# Patient Record
Sex: Male | Born: 1990 | Race: White | Hispanic: No | Marital: Single | Smoking: Former smoker
Health system: Southern US, Community
[De-identification: ages and names within clinical notes are randomized; demographics above are authoritative.]

## PROBLEM LIST (undated history)

## (undated) DIAGNOSIS — M25539 Pain in unspecified wrist: Secondary | ICD-10-CM

## (undated) DIAGNOSIS — G8929 Other chronic pain: Secondary | ICD-10-CM

## (undated) DIAGNOSIS — M79643 Pain in unspecified hand: Secondary | ICD-10-CM

## (undated) HISTORY — PX: VASECTOMY: SHX75

---

## 2012-04-08 ENCOUNTER — Emergency Department: Payer: Self-pay | Admitting: Unknown Physician Specialty

## 2012-04-08 LAB — CBC WITH DIFFERENTIAL/PLATELET
Basophil #: 0 10*3/uL (ref 0.0–0.1)
Eosinophil #: 0.1 10*3/uL (ref 0.0–0.7)
Eosinophil %: 1.1 %
Lymphocyte #: 1.4 10*3/uL (ref 1.0–3.6)
Lymphocyte %: 31.7 %
MCHC: 34 g/dL (ref 32.0–36.0)
Monocyte %: 11.4 %
Platelet: 249 10*3/uL (ref 150–440)
RBC: 5.23 10*6/uL (ref 4.40–5.90)
RDW: 12.5 % (ref 11.5–14.5)
WBC: 4.4 10*3/uL (ref 3.8–10.6)

## 2012-04-08 LAB — URINALYSIS, COMPLETE
Bacteria: NONE SEEN
Bilirubin,UR: NEGATIVE
Glucose,UR: NEGATIVE mg/dL (ref 0–75)
Ketone: NEGATIVE
Leukocyte Esterase: NEGATIVE
Ph: 6 (ref 4.5–8.0)
Protein: NEGATIVE
RBC,UR: <1 /HPF (ref 0–5)
Squamous Epithelial: NONE SEEN

## 2012-04-08 LAB — COMPREHENSIVE METABOLIC PANEL
Albumin: 4.4 g/dL (ref 3.4–5.0)
Alkaline Phosphatase: 74 U/L (ref 50–136)
Anion Gap: 4 — ABNORMAL LOW (ref 7–16)
BUN: 12 mg/dL (ref 7–18)
Bilirubin,Total: 0.4 mg/dL (ref 0.2–1.0)
Calcium, Total: 9.1 mg/dL (ref 8.5–10.1)
Creatinine: 0.76 mg/dL (ref 0.60–1.30)
EGFR (African American): >60
EGFR (Non-African Amer.): >60
Glucose: 80 mg/dL (ref 65–99)
Potassium: 4.1 mmol/L (ref 3.5–5.1)
SGOT(AST): 25 U/L (ref 15–37)
SGPT (ALT): 25 U/L (ref 12–78)
Total Protein: 7.4 g/dL (ref 6.4–8.2)

## 2012-04-08 LAB — LIPASE, BLOOD: Lipase: 77 U/L (ref 73–393)

## 2012-09-04 ENCOUNTER — Ambulatory Visit: Payer: Self-pay

## 2013-08-18 ENCOUNTER — Ambulatory Visit: Payer: Self-pay | Admitting: Physician Assistant

## 2014-08-03 ENCOUNTER — Ambulatory Visit
Admit: 2014-08-03 | Disposition: A | Discharge status: Home / Self Care | Payer: Self-pay | Attending: Internal Medicine | Admitting: Internal Medicine

## 2014-08-03 LAB — URINALYSIS, COMPLETE
Bacteria: NEGATIVE
Bilirubin,UR: NEGATIVE
Blood: NEGATIVE
Glucose,UR: NEGATIVE
Ketone: NEGATIVE
Leukocyte Esterase: NEGATIVE
Nitrite: NEGATIVE
Ph: 7.5 (ref 5.0–8.0)
Protein: NEGATIVE
Specific Gravity: 1.015 (ref 1.000–1.030)
Squamous Epithelial: NONE SEEN
WBC UR: NONE SEEN /HPF (ref 0–5)

## 2015-10-10 ENCOUNTER — Ambulatory Visit (INDEPENDENT_AMBULATORY_CARE_PROVIDER_SITE_OTHER): Payer: Federal, State, Local not specified - PPO

## 2015-10-10 ENCOUNTER — Ambulatory Visit
Admission: EM | Admit: 2015-10-10 | Discharge: 2015-10-10 | Disposition: A | Discharge status: Home / Self Care | Payer: Federal, State, Local not specified - PPO | Attending: Family Medicine | Admitting: Family Medicine

## 2015-10-10 ENCOUNTER — Encounter: Payer: Self-pay | Admitting: *Deleted

## 2015-10-10 DIAGNOSIS — S7001XA Contusion of right hip, initial encounter: Secondary | ICD-10-CM

## 2015-10-10 MED ORDER — TRAMADOL HCL 50 MG PO TABS: 50.0000 mg | ORAL_TABLET | Freq: Three times a day (TID) | ORAL | Status: DC | PRN

## 2015-10-10 MED ORDER — MELOXICAM 15 MG PO TABS
15.0000 mg | ORAL_TABLET | Freq: Every day | ORAL | Status: DC | PRN
Start: 2015-10-10 — End: 2016-01-13
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
## 2015-10-10 NOTE — Discharge Instructions (Signed)
° °  Take medication as prescribed. Rest. Apply ice as needed.   Follow up with your primary care physician this week as needed. Return to Urgent care for new or worsening concerns.   Contusion A contusion is a deep bruise. Contusions are the result of a blunt injury to tissues and muscle fibers under the skin. The injury causes bleeding under the skin. The skin overlying the contusion may turn blue, purple, or yellow. Minor injuries will give you a painless contusion, but more severe contusions may stay painful and swollen for a few weeks.  CAUSES  This condition is usually caused by a blow, trauma, or direct force to an area of the body. SYMPTOMS  Symptoms of this condition include:  Swelling of the injured area.  Pain and tenderness in the injured area.  Discoloration. The area may have redness and then turn blue, purple, or yellow. DIAGNOSIS  This condition is diagnosed based on a physical exam and medical history. An X-ray, CT scan, or MRI may be needed to determine if there are any associated injuries, such as broken bones (fractures). TREATMENT  Specific treatment for this condition depends on what area of the body was injured. In general, the best treatment for a contusion is resting, icing, applying pressure to (compression), and elevating the injured area. This is often called the RICE strategy. Over-the-counter anti-inflammatory medicines may also be recommended for pain control.  HOME CARE INSTRUCTIONS   Rest the injured area.  If directed, apply ice to the injured area:  Put ice in a plastic bag.  Place a towel between your skin and the bag.  Leave the ice on for 20 minutes, 2-3 times per day.  If directed, apply light compression to the injured area using an elastic bandage. Make sure the bandage is not wrapped too tightly. Remove and reapply the bandage as directed by your health care provider.  If possible, raise (elevate) the injured area above the level of your  heart while you are sitting or lying down.  Take over-the-counter and prescription medicines only as told by your health care provider. SEEK MEDICAL CARE IF:  Your symptoms do not improve after several days of treatment.  Your symptoms get worse.  You have difficulty moving the injured area. SEEK IMMEDIATE MEDICAL CARE IF:   You have severe pain.  You have numbness in a hand or foot.  Your hand or foot turns pale or cold.   This information is not intended to replace advice given to you by your health care provider. Make sure you discuss any questions you have with your health care provider.   Document Released: 01/27/2005 Document Revised: 01/08/2015 Document Reviewed: 09/04/2014 Elsevier Interactive Patient Education [employer]2016 Elsevier Inc.

## 2016-01-13 ENCOUNTER — Encounter: Payer: Self-pay | Admitting: *Deleted

## 2016-01-13 ENCOUNTER — Ambulatory Visit
Admission: EM | Admit: 2016-01-13 | Discharge: 2016-01-13 | Disposition: A | Discharge status: Home / Self Care | Payer: Federal, State, Local not specified - PPO | Attending: Family Medicine | Admitting: Family Medicine

## 2016-01-13 DIAGNOSIS — G5603 Carpal tunnel syndrome, bilateral upper limbs: Secondary | ICD-10-CM

## 2016-01-13 HISTORY — DX: Pain in unspecified wrist: M25.539

## 2016-01-13 HISTORY — DX: Other chronic pain: G89.29

## 2016-01-13 HISTORY — DX: Pain in unspecified hand: M79.643

## 2016-01-13 MED ORDER — KETOROLAC TROMETHAMINE 60 MG/2ML IM SOLN
60.0000 mg | Freq: Once | INTRAMUSCULAR | Status: AC
  Administered 2016-01-13: 60 mg via INTRAMUSCULAR

## 2016-01-13 MED ORDER — NAPROXEN 500 MG PO TABS: 500.0000 mg | ORAL_TABLET | Freq: Two times a day (BID) | ORAL | 0 refills | Status: DC

## 2016-01-13 NOTE — ED Triage Notes (Signed)
Patient has a history of bilateral wrist pain that he receives treatment for thru [facility]. His hand and wrist pain have returned and has been severe for the last 3 days.

## 2016-01-13 NOTE — Discharge Instructions (Signed)
Patient should wear his splints continuously for relief of the pain. Take Naprosyn twice a day with food for pain control. Return to surgeon for further evaluation and care.

## 2016-01-13 NOTE — ED Provider Notes (Signed)
CSN: [id number]     Arrival date & time 01/13/16  1044 History   First MD Initiated Contact with Patient 01/13/16 1129     Chief Complaint  Patient presents with  . Wrist Pain  . Hand Pain   (Consider location/radiation/quality/duration/timing/severity/associated sxs/prior Treatment) HPI  This a 25 year old male who presents with bilateral wrist and hand pain that has been under the care and receiving cortisone injections and orthopedic surgeon for known carpal tunnel syndrome. The patient states that he's been having increasing pain and unable to work today and was not able to get into see his primary care physician or the orthopedic surgeon. The pain that he describes contains elements of both the median and ulnar nerves bilaterally. This apparently is not a pure carpal tunnel syndrome according to his symptoms. He states he's had an EMG in the past which after being performed the surgeon decided not to do carpal tunnel releases. 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  Past Medical History:  Diagnosis Date  . Chronic hand pain   . Wrist pain, chronic    History reviewed. No pertinent surgical history. History reviewed. No pertinent family history. Social History  Substance Use Topics  . Smoking status: Former Games developer  . Smokeless tobacco: Current User  . Alcohol use Yes    Review of Systems  Constitutional: Positive for activity change. Negative for chills, fatigue and fever.  Musculoskeletal: Positive for myalgias.  All other systems reviewed and are negative.   Allergies  Review of patient's allergies indicates no known allergies.  Home Medications   Prior to Admission medications   Medication Sig Start Date End Date Taking? Authorizing Provider  naproxen (NAPROSYN) 500 MG tablet Take 1 tablet (500 mg total) by mouth 2 (two) times daily with a meal. 01/13/16   Lutricia Feil, PA-C   Meds Ordered and Administered this  Visit   Medications  ketorolac (TORADOL) injection 60 mg (60 mg Intramuscular Given 01/13/16 1157)    BP 129/81 (BP Location: Left Arm)   Pulse 93   Temp 98 F (36.7 C) (Oral)   Resp 16   Ht 5\' 10"  (1.778 m)   Wt 135 lb (61.2 kg)   SpO2 99%   BMI 19.37 kg/m  No data found.   Physical Exam  Constitutional: He is oriented to person, place, and time. He appears well-developed and well-nourished. No distress.  HENT:  Head: Normocephalic and atraumatic.  Eyes: EOM are normal. Pupils are equal, round, and reactive to light. Right eye exhibits no discharge. Left eye exhibits no discharge.  Neck: Normal range of motion. Neck supple.  Musculoskeletal:  Examination of both hands shows no thenar wasting. He has had negative Tinel's at the wrist. Does have pain with attempted Phalen's complains immediately upon flexion. First dorsal interosseous appears strong. Is a normal sweating pattern.  Neurological: He is alert and oriented to person, place, and time.  Skin: Skin is warm and dry. He is not diaphoretic.  Psychiatric: He has a normal mood and affect. His behavior is normal. Judgment and thought content normal.  Nursing note and vitals reviewed.   Urgent Care Course   Clinical Course    Procedures (including critical care time)  Labs Review Labs Reviewed - No data to display  Imaging Review No results found.   Visual Acuity Review  Right Eye Distance:   Left Eye Distance:   Bilateral Distance:  Right Eye Near:   Left Eye Near:    Bilateral Near:     Medications  ketorolac (TORADOL) injection 60 mg (60 mg Intramuscular Given 01/13/16 1157)  Patient was given bilateral wrist splints  MDM   1. Bilateral carpal tunnel syndrome   The patient does not exhibit pure bilateral carpal tunnel syndrome characteristics but is having significant hand pain which hopefully will respond to the use of splints. I told him that he must use the splint  continuously removing them  only for personal care or showering. He should sleep with them as well. Given him an injection of Toradol to initiate anti-inflammatory effect Provide him with Naprosyn for ongoing anti-inflammatory coverage. I recommended that he return to his orthopedic surgeon for additional care and evaluation as necessary. Note for work today but want him to return to work tomorrow. He should also wear his wrist splints at work. Discharge Medication List as of 01/13/2016 12:08 PM    START taking these medications   Details  naproxen (NAPROSYN) 500 MG tablet Take 1 tablet (500 mg total) by mouth 2 (two) times daily with a meal., Starting Tue 01/13/2016, Normal           Lutricia FeilWilliam P Sujay Grundman, PA-C 01/13/16 1231

## 2017-09-24 ENCOUNTER — Ambulatory Visit
Admission: EM | Admit: 2017-09-24 | Discharge: 2017-09-24 | Disposition: A | Discharge status: Home / Self Care | Payer: Worker's Compensation | Attending: Family Medicine | Admitting: Family Medicine

## 2017-09-24 ENCOUNTER — Encounter: Payer: Self-pay | Admitting: Gynecology

## 2017-09-24 ENCOUNTER — Other Ambulatory Visit: Payer: Self-pay

## 2017-09-24 DIAGNOSIS — S0501XA Injury of conjunctiva and corneal abrasion without foreign body, right eye, initial encounter: Secondary | ICD-10-CM

## 2017-09-24 MED ORDER — MOXIFLOXACIN HCL 0.5 % OP SOLN: 1.0000 [drp] | Freq: Three times a day (TID) | OPHTHALMIC | 0 refills | Status: DC

## 2017-09-24 MED ORDER — HYDROCODONE-ACETAMINOPHEN 5-325 MG PO TABS: ORAL_TABLET | ORAL | 0 refills | Status: DC

## 2017-09-24 NOTE — ED Triage Notes (Signed)
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

## 2017-09-24 NOTE — ED Provider Notes (Signed)
[facility]    CSN: [id number] Arrival date & time: 09/24/17  0801     History   Chief Complaint Chief Complaint  Patient presents with  . Eye Injury  . Work Related Injury    HPI Derrick Porter is a 27 y.o. male.   27 yo male with a c/o right eye discomfort and foreign body sensation since last night after injuring it at work. States he was cutting and grinding some metal at work and wearing eye safety glasses, however still got some metal shavings and dust that was blowing around into his right eye. States he flushed it and at home was able to get some tiny pieces of metal out of his eye with a magnet.   The history is provided by the patient.    Past Medical History:  Diagnosis Date  . Chronic hand pain   . Wrist pain, chronic     There are no active problems to display for this patient.   History reviewed. No pertinent surgical history.     Home Medications    Prior to Admission medications   Medication Sig Start Date End Date Taking? Authorizing Provider  HYDROcodone-acetaminophen (NORCO/VICODIN) 5-325 MG tablet 1-2 tabs po bid prn 09/24/17   Payton Mccallum, MD  moxifloxacin (VIGAMOX) 0.5 % ophthalmic solution Place 1 drop into the right eye 3 (three) times daily. 09/24/17   Payton Mccallum, MD  naproxen (NAPROSYN) 500 MG tablet Take 1 tablet (500 mg total) by mouth 2 (two) times daily with a meal. 01/13/16   Lutricia Feil, PA-C    Family History History reviewed. No pertinent family history.  Social History Social History   Tobacco Use  . Smoking status: Former Games developer  . Smokeless tobacco: Current User  Substance Use Topics  . Alcohol use: Yes  . Drug use: No     Allergies   Patient has no known allergies.   Review of Systems Review of Systems   Physical Exam Triage Vital Signs ED Triage Vitals  Enc Vitals Group     BP 09/24/17 0829 115/77     Pulse Rate 09/24/17 0829 67     Resp 09/24/17 0829 16     Temp 09/24/17 0829  98.2 F (36.8 C)     Temp Source 09/24/17 0829 Oral     SpO2 09/24/17 0829 100 %     Weight 09/24/17 0828 145 lb (65.8 kg)     Height 09/24/17 0828  (1.778 m)     Head Circumference --      Peak Flow --      Pain Score 09/24/17 0828 7     Pain Loc --      Pain Edu? --      Excl. in GC? --    No data found.  Updated Vital Signs BP 115/77 (BP Location: Left Arm)   Pulse 67   Temp 98.2 F (36.8 C) (Oral)   Resp 16   Ht  (1.778 m)   Wt 145 lb (65.8 kg)   SpO2 100%   BMI 20.81 kg/m   Visual Acuity Right Eye Distance: 20/70 Left Eye Distance: 20/20 Bilateral Distance: 20/40(without corrective lens)  Right Eye Near:   Left Eye Near:    Bilateral Near:     Physical Exam  Constitutional: He appears well-developed and well-nourished. No distress.  Eyes: Pupils are equal, round, and reactive to light. EOM and lids are normal. Lids are everted and swept, no  foreign bodies found. Right eye exhibits no exudate. No foreign body present in the right eye. Right conjunctiva is injected. Right conjunctiva has no hemorrhage. Left conjunctiva is not injected.  Slit lamp exam:      The right eye shows corneal abrasion and fluorescein uptake.    Skin: He is not diaphoretic.  Nursing note and vitals reviewed.    UC Treatments / Results  Labs (all labs ordered are listed, but only abnormal results are displayed) Labs Reviewed - No data to display  EKG None  Radiology No results found.  Procedures Procedures (including critical care time)  Medications Ordered in UC Medications - No data to display  Initial Impression / Assessment and Plan / UC Course  I have reviewed the triage vital signs and the nursing notes.  Pertinent labs & imaging results that were available during my care of the patient were reviewed by me and considered in my medical decision making (see chart for details).     Final Clinical Impressions(s) / UC Diagnoses   Final diagnoses:    Abrasion of right cornea, initial encounter     Discharge Instructions     Tylenol and/or advil Follow up next week (Tuesday or Wednesday) with ophthalmologist (or go to Emergency Department over the weekend if symptoms worsen)    ED Prescriptions    Medication Sig Dispense Auth. Provider   moxifloxacin (VIGAMOX) 0.5 % ophthalmic solution Place 1 drop into the right eye 3 (three) times daily. 3 mL Payton Mccallum, MD   HYDROcodone-acetaminophen (NORCO/VICODIN) 5-325 MG tablet 1-2 tabs po bid prn 5 tablet Payton Mccallum, MD     1.diagnosis reviewed with patient 2. rx as per orders above; reviewed possible side effects, interactions, risks and benefits  3. Recommend supportive treatment with otc analgesics prn 4. Recommend follow up with ophthalmologist after the weekend (early next week); if symptoms worsen over the weekend then recommend going to Emergency Department 5. May not return to usual job until cleared by ophthalmologist to return    Controlled Substance Prescriptions [place] Controlled Substance Registry consulted? Not Applicable   Payton Mccallum, MD 09/24/17 1316

## 2017-09-24 NOTE — Discharge Instructions (Signed)
Tylenol and/or advil Follow up next week (Tuesday or Wednesday) with ophthalmologist (or go to Emergency Department over the weekend if symptoms worsen)

## 2019-02-07 ENCOUNTER — Other Ambulatory Visit: Payer: Self-pay | Admitting: *Deleted

## 2019-02-07 DIAGNOSIS — Z20822 Contact with and (suspected) exposure to covid-19: Secondary | ICD-10-CM

## 2019-02-08 LAB — NOVEL CORONAVIRUS, NAA: SARS-CoV-2, NAA: NOT DETECTED

## 2019-07-22 ENCOUNTER — Encounter: Payer: Self-pay | Admitting: Emergency Medicine

## 2019-07-22 ENCOUNTER — Ambulatory Visit
Admission: EM | Admit: 2019-07-22 | Discharge: 2019-07-22 | Disposition: A | Discharge status: Home / Self Care | Payer: PRIVATE HEALTH INSURANCE | Attending: Emergency Medicine | Admitting: Emergency Medicine

## 2019-07-22 ENCOUNTER — Other Ambulatory Visit: Payer: Self-pay

## 2019-07-22 DIAGNOSIS — Z20822 Contact with and (suspected) exposure to covid-19: Secondary | ICD-10-CM | POA: Insufficient documentation

## 2019-07-22 DIAGNOSIS — J020 Streptococcal pharyngitis: Secondary | ICD-10-CM | POA: Diagnosis not present

## 2019-07-22 DIAGNOSIS — Z87891 Personal history of nicotine dependence: Secondary | ICD-10-CM | POA: Diagnosis not present

## 2019-07-22 DIAGNOSIS — R509 Fever, unspecified: Secondary | ICD-10-CM | POA: Insufficient documentation

## 2019-07-22 DIAGNOSIS — J029 Acute pharyngitis, unspecified: Secondary | ICD-10-CM | POA: Insufficient documentation

## 2019-07-22 LAB — GROUP A STREP BY PCR: Group A Strep by PCR: DETECTED — AB

## 2019-07-22 MED ORDER — LIDOCAINE VISCOUS HCL 2 % MT SOLN: 10.0000 mL | OROMUCOSAL | 0 refills | Status: DC | PRN

## 2019-07-22 MED ORDER — PENICILLIN V POTASSIUM 500 MG PO TABS: 500.0000 mg | ORAL_TABLET | Freq: Two times a day (BID) | ORAL | 0 refills | Status: AC

## 2019-07-22 NOTE — ED Provider Notes (Signed)
[facility]    CSN: [id number] Arrival date & time: 07/22/19  1039      History   Chief Complaint Chief Complaint  Patient presents with  . Sore Throat  . Generalized Body Aches  . Fever    HPI Derrick Porter is a 29 y.o. male.   29 year old male presents with sore throat, body aches, chills and fever that started yesterday. Took temperature yesterday and had a fever of 102. Also having slight nasal congestion, ear pressure and nausea but denies any cough, vomiting or diarrhea. Has taken Tylenol and Nyquil with minimal relief. Wife was seen here at Urgent Care about 2 weeks ago and was positive for Strep- He indicated that she finished Zithromax (she is allergic to Penicillin) and symptoms had resolved but now having a sore throat again but has not returned to be re-evaluated. Otherwise no other chronic health issues for the patient and takes no daily medication.   The history is provided by the patient.    Past Medical History:  Diagnosis Date  . Chronic hand pain   . Wrist pain, chronic     There are no problems to display for this patient.   History reviewed. No pertinent surgical history.     Home Medications    Prior to Admission medications   Medication Sig Start Date End Date Taking? Authorizing Provider  lidocaine (XYLOCAINE) 2 % solution Use as directed 10 mLs in the mouth or throat every 3 (three) hours as needed (for throat pain- spit out, do not swallow). 07/22/19   Starlee Corralejo, Ali Lowe, NP  penicillin v potassium (VEETID) 500 MG tablet Take 1 tablet (500 mg total) by mouth 2 (two) times daily for 10 days. 07/22/19 08/01/19  Sudie Grumbling, NP    Family History Family History  Problem Relation Age of Onset  . Healthy Mother   . Hypertension Father     Social History Social History   Tobacco Use  . Smoking status: Former Games developer  . Smokeless tobacco: Current User  Substance Use Topics  . Alcohol use: Yes  . Drug use: No      Allergies   Patient has no known allergies.   Review of Systems Review of Systems  Constitutional: Positive for activity change, appetite change, chills, fatigue and fever. Negative for diaphoresis.  HENT: Positive for congestion, ear pain (pressure), rhinorrhea, sore throat and trouble swallowing. Negative for ear discharge, facial swelling, mouth sores, nosebleeds, postnasal drip, sinus pressure, sinus pain, sneezing and tinnitus.   Eyes: Negative for pain, discharge, redness and itching.  Respiratory: Negative for cough, chest tightness, shortness of breath and wheezing.   Gastrointestinal: Positive for nausea. Negative for abdominal pain, diarrhea and vomiting.  Musculoskeletal: Positive for arthralgias and myalgias. Negative for neck pain and neck stiffness.  Skin: Negative for color change, rash and wound.  Allergic/Immunologic: Negative for environmental allergies, food allergies and immunocompromised state.  Neurological: Positive for headaches. Negative for dizziness, seizures, syncope, weakness, light-headedness and numbness.  Hematological: Positive for adenopathy. Does not bruise/bleed easily.     Physical Exam Triage Vital Signs ED Triage Vitals  Enc Vitals Group     BP 07/22/19 1056 140/75     Pulse Rate 07/22/19 1056 79     Resp 07/22/19 1056 16     Temp 07/22/19 1056 100.1 F (37.8 C)     Temp Source 07/22/19 1056 Oral     SpO2 07/22/19 1056 99 %  Weight 07/22/19 1052 165 lb (74.8 kg)     Height 07/22/19 1052 5\' 10"  (1.778 m)     Head Circumference --      Peak Flow --      Pain Score 07/22/19 1052 9     Pain Loc --      Pain Edu? --      Excl. in [place]? --    No data found.  Updated Vital Signs BP 140/75 (BP Location: Left Arm)   Pulse 79   Temp 100.1 F (37.8 C) (Oral)   Resp 16   Ht 5\' 10"  (1.778 m)   Wt 165 lb (74.8 kg)   SpO2 99%   BMI 23.68 kg/m   Visual Acuity Right Eye Distance:   Left Eye Distance:   Bilateral Distance:     Right Eye Near:   Left Eye Near:    Bilateral Near:     Physical Exam Vitals and nursing note reviewed.  Constitutional:      General: He is awake. He is not in acute distress.    Appearance: He is well-developed and well-groomed. He is ill-appearing.     Comments: Patient is lying down on exam table in no acute distress but appears ill and tired.   HENT:     Head: Normocephalic and atraumatic.     Right Ear: Hearing, tympanic membrane, ear canal and external ear normal.     Left Ear: Hearing, tympanic membrane, ear canal and external ear normal.     Nose: Rhinorrhea present. Rhinorrhea is clear.     Right Sinus: No maxillary sinus tenderness or frontal sinus tenderness.     Left Sinus: No maxillary sinus tenderness or frontal sinus tenderness.     Mouth/Throat:     Lips: Pink.     Mouth: Mucous membranes are moist.     Pharynx: Uvula midline. Pharyngeal swelling and posterior oropharyngeal erythema present. No oropharyngeal exudate or uvula swelling.     Tonsils: No tonsillar exudate.  Eyes:     Extraocular Movements: Extraocular movements intact.     Conjunctiva/sclera: Conjunctivae normal.  Cardiovascular:     Rate and Rhythm: Normal rate and regular rhythm.     Heart sounds: Normal heart sounds. No murmur.  Pulmonary:     Effort: Pulmonary effort is normal. No respiratory distress.     Breath sounds: Normal breath sounds and air entry. No decreased air movement. No decreased breath sounds, wheezing, rhonchi or rales.  Musculoskeletal:     Cervical back: Normal range of motion and neck supple. No rigidity. Normal range of motion.  Lymphadenopathy:     Cervical: Cervical adenopathy present.     Right cervical: Superficial cervical adenopathy and deep cervical adenopathy present.     Left cervical: Superficial cervical adenopathy and deep cervical adenopathy present.  Skin:    General: Skin is warm and dry.     Capillary Refill: Capillary refill takes less than 2 seconds.   Neurological:     General: No focal deficit present.     Mental Status: He is alert and oriented to person, place, and time.  Psychiatric:        Mood and Affect: Mood normal.        Behavior: Behavior normal. Behavior is cooperative.        Thought Content: Thought content normal.        Judgment: Judgment normal.      UC Treatments / Results  Labs (all labs ordered are listed,  but only abnormal results are displayed) Labs Reviewed  GROUP A STREP BY PCR - Abnormal; Notable for the following components:      Result Value   Group A Strep by PCR DETECTED (*)    All other components within normal limits  SARS CORONAVIRUS 2 (TAT 6-24 HRS)    EKG   Radiology No results found.  Procedures Procedures (including critical care time)  Medications Ordered in UC Medications - No data to display  Initial Impression / Assessment and Plan / UC Course  I have reviewed the triage vital signs and the nursing notes.  Pertinent labs & imaging results that were available during my care of the patient were reviewed by me and considered in my medical decision making (see chart for details).    Reviewed positive strep test with patient. Recommend start Penicillin 500mg  twice a day as directed- finish all antibiotic even if feeling better. May use Viscous lidocaine- swish and spit out every 3 hours as needed for throat pain. Take Ibuprofen 800mg  as directed as needed for throat pain and fever. Rest. Stay at home. Push fluids to keep well hydrated. Follow-up pending COVID 19 test results and in 2 to 3 days if not improving.   Final Clinical Impressions(s) / UC Diagnoses   Final diagnoses:  Strep pharyngitis  Sore throat  Fever, unspecified     Discharge Instructions     Recommend start Penicillin 500mg  twice a day for 10 days. Use lidocaine mouth wash- swish and spit out every 3 hours as needed for throat pain. May also take Ibuprofen 800mg  every 8 hours as needed for pain. Rest. Stay at  home. Follow-up pending COVID 19 test results and in 2 to 3 days if not improving.     ED Prescriptions    Medication Sig Dispense Auth. Provider   penicillin v potassium (VEETID) 500 MG tablet Take 1 tablet (500 mg total) by mouth 2 (two) times daily for 10 days. 20 tablet , NP   lidocaine (XYLOCAINE) 2 % solution Use as directed 10 mLs in the mouth or throat every 3 (three) hours as needed (for throat pain- spit out, do not swallow). 100 mL , NP     PDMP not reviewed this encounter.   , NP 07/22/19 2121

## 2019-07-22 NOTE — ED Triage Notes (Signed)
Patient c/o sore throat, bodyaches, and fever that started yesterday.  Patient states that his wife was treated for strep throat.

## 2019-07-22 NOTE — Discharge Instructions (Addendum)
Recommend start Penicillin 500mg  twice a day for 10 days. Use lidocaine mouth wash- swish and spit out every 3 hours as needed for throat pain. May also take Ibuprofen 800mg  every 8 hours as needed for pain. Rest. Stay at home. Follow-up pending COVID 19 test results and in 2 to 3 days if not improving.

## 2019-07-23 LAB — SARS CORONAVIRUS 2 (TAT 6-24 HRS): SARS Coronavirus 2: NEGATIVE

## 2019-09-09 ENCOUNTER — Ambulatory Visit
Admission: EM | Admit: 2019-09-09 | Discharge: 2019-09-09 | Disposition: A | Discharge status: Home / Self Care | Payer: PRIVATE HEALTH INSURANCE | Attending: Family Medicine | Admitting: Family Medicine

## 2019-09-09 ENCOUNTER — Other Ambulatory Visit: Payer: Self-pay

## 2019-09-09 DIAGNOSIS — R05 Cough: Secondary | ICD-10-CM

## 2019-09-09 DIAGNOSIS — Z20822 Contact with and (suspected) exposure to covid-19: Secondary | ICD-10-CM | POA: Insufficient documentation

## 2019-09-09 DIAGNOSIS — R059 Cough, unspecified: Secondary | ICD-10-CM

## 2019-09-09 DIAGNOSIS — Z03818 Encounter for observation for suspected exposure to other biological agents ruled out: Secondary | ICD-10-CM

## 2019-09-09 NOTE — ED Provider Notes (Signed)
[place],    Name: Derrick Porter DOB: [birth year] MRN: [id number] CSN: [id number] PCP: Patient, No Pcp Per  Arrival date and time:  09/09/19 0856  Chief Complaint:  Cough  NOTE: Prior to seeing the patient today, I have reviewed the triage nursing documentation and vital signs. Clinical staff has updated patient's PMH/PSHx, current medication list, and drug allergies/intolerances to ensure comprehensive history available to assist in medical decision making.   History:   HPI: Derrick Porter is a 29 y.o. male who presents today with complaints of cough that started approximately 3 days ago. Symptoms started on Thursday with a generalized headache and paranasal sinus pressure. He notes that his boss developed similar symptoms. Over the last couple of days, sinus symptoms have resolved and everything has "moved into his chest". He has not experienced any fevers, chills, sore throat, or otalgia. Cough has been non-productive with no associated shortness of breath or wheezing. PMH is not significant for seasonal allergies.  He denies that he has experienced any nausea, vomiting, diarrhea, or abdominal pain. He is eating and drinking well. Patient denies any perceived alterations to his sense of taste or smell. Patient denies being in close contact with anyone known to be ill; no one else is his home has experienced a similar symptom constellation. He has not been tested for SARS-CoV-2 (novel coronavirus) in the past 14 days; last tested negative on 07/22/2019 per his report. Patient presents advising that his wife is concerned for possible SARS-CoV-2. He notes that she is immunocompromised. In efforts to conservatively manage his symptoms at home, the patient notes that he has used Nyquil, which has helped to improve his symptoms.    Past Medical History:  Diagnosis Date  . Chronic hand pain   . Wrist pain, chronic     History reviewed. No pertinent surgical history.  Family History    Problem Relation Age of Onset  . Healthy Mother   . Hypertension Father     Social History   Tobacco Use  . Smoking status: Former Games developer  . Smokeless tobacco: Current User    Types: Chew  Substance Use Topics  . Alcohol use: Yes  . Drug use: No    There are no problems to display for this patient.   Home Medications:    No outpatient medications have been marked as taking for the 09/09/19 encounter [facility] Encounter).    Allergies:   Patient has no known allergies.  Review of Systems (ROS):  Review of systems NEGATIVE unless otherwise noted in narrative H&P section.   Vital Signs: Today's Vitals   09/09/19 0916 09/09/19 0918 09/09/19 0933  BP:  (!) 126/58   Pulse:  (!) 58   Resp:  18   Temp:  98.4 F (36.9 C)   TempSrc:  Oral   SpO2:  99%   Weight: 160 lb (72.6 kg)    Height: 5\' 10"  (1.778 m)    PainSc: 0-No pain  0-No pain    Physical Exam: Physical Exam  Constitutional: He is oriented to person, place, and time and well-developed, well-nourished, and in no distress.  HENT:  Head: Normocephalic and atraumatic.  Nose: Nose normal.  Mouth/Throat: Uvula is midline, oropharynx is clear and moist and mucous membranes are normal.  Eyes: Pupils are equal, round, and reactive to light.  Cardiovascular: Normal rate, regular rhythm, normal heart sounds and intact distal pulses.  Pulmonary/Chest: Effort normal and breath sounds normal.  Mild dry cough noted in clinic. No  SOB or increased WOB. No distress. Able to speak in complete sentences without difficulties. SPO2 99% on RA.  Lymphadenopathy:    He has no cervical adenopathy.  Neurological: He is alert and oriented to person, place, and time. Gait normal.  Skin: Skin is warm and dry. No rash noted. He is not diaphoretic.  Psychiatric: Mood, memory, affect and judgment normal.  Nursing note and vitals reviewed.   Urgent Care Treatments / Results:   Orders Placed This Encounter  Procedures  . SARS  CORONAVIRUS 2 (TAT 6-24 HRS) Nasopharyngeal Nasopharyngeal Swab    LABS: PLEASE NOTE: all labs that were ordered this encounter are listed, however only abnormal results are displayed. Labs Reviewed  SARS CORONAVIRUS 2 (TAT 6-24 HRS)    EKG: -None  RADIOLOGY: No results found.  PROCEDURES: Procedures  MEDICATIONS RECEIVED THIS VISIT: Medications - No data to display  PERTINENT CLINICAL COURSE NOTES/UPDATES:   Initial Impression / Assessment and Plan / Urgent Care Course:  Pertinent labs & imaging results that were available during my care of the patient were personally reviewed by me and considered in my medical decision making (see lab/imaging section of note for values and interpretations).  Derrick Porter is a 29 y.o. male who presents to [facility] today with complaints of Cough  Patient overall well appearing and in no acute distress today in clinic. Presenting symptoms (see HPI) and exam as documented above. He presents with symptoms associated with SARS-CoV-2 (novel coronavirus). Discussed typical symptom constellation. Reviewed potential for infection and need for testing. Patient amenable to being tested. SARS-CoV-2 swab collected by certified clinical staff. Discussed variable turn around times associated with testing, as swabs are being processed at the main campus of [facility] in [place], and have been taking 12-24 hours to come back. He was advised to self quarantine, per [facility] DHHS guidelines, until negative results received. These measures are being implemented out of an abundance of caution to prevent transmission and spread during the current SARS-CoV-2 pandemic.  Cough likely related to allergies, however until ruled out with confirmatory lab testing, SARS-CoV-2 remains part of the differential. His testing is pending at this time.  Intervention for cough offered, however patient declined citing that his symptoms are mild/controlled with OTC interventions.  Reviewed supportive care; rest and hydration. Patient may also trial allergy medication, such as cetirizine or loratadine, to help with his symptoms. Current clinical condition warrants patient being out of work in order to quarantine while waiting for testing results. He was provided with the appropriate documentation to provide to his place of employment that will allow for him to RTW on 09/11/2019 with no restrictions. RTW is contingent on his SARS-CoV-2 test results being reviewed as negative.     Discussed follow up with primary care physician in 1 week for re-evaluation. I have reviewed the follow up and strict return precautions for any new or worsening symptoms. Patient is aware of symptoms that would be deemed urgent/emergent, and would thus require further evaluation either here or in the emergency department. At the time of discharge, he verbalized understanding and consent with the discharge plan as it was reviewed with him. All questions were fielded by provider and/or clinic staff prior to patient discharge.    Final Clinical Impressions / Urgent Care Diagnoses:   Final diagnoses:  Cough  Encounter for laboratory testing for COVID-19 virus    New Prescriptions:  [place] Controlled Substance Registry consulted? Not Applicable  No orders of the defined types  were placed in this encounter.   Recommended Follow up Care:  Patient encouraged to follow up with the following provider within the specified time frame, or sooner as dictated by the severity of his symptoms. As always, he was instructed that for any urgent/emergent care needs, he should seek care either here or in the emergency department for more immediate evaluation.  Follow-up Information    PCP In 1 week.   Why: General reassessment of symptoms if not improving        NOTE: This note was prepared using Lobbyist along with smaller Company secretary. Despite my best ability to proofread, there is the  potential that transcriptional errors may still occur from this process, and are completely unintentional.    Karen Kitchens, NP 09/09/19 [phone]

## 2019-09-09 NOTE — Discharge Instructions (Signed)
It was very nice seeing you today in clinic. Thank you for entrusting me with your care.   Make sure your are staying well hydrated. Continue over the counter cough medication. Call clinic if not working and I can send something else in.   You were tested for SARS-CoV-2 (novel coronavirus) today. Testing is being processed at the main campus of [facility] in [place], and have been taking 12-24 hours to come back. Current recommendations from the the CDC and [place] DHHS require that you remain out of work in order to quarantine at home until negative test results are have been received. In the event that your test results are positive, you will be contacted with further directives. These measures are being implemented out of an abundance of caution to prevent transmission and spread during the current SARS-CoV-2 pandemic.  Make arrangements to follow up with your regular doctor in 1 week for re-evaluation if not improving. If your symptoms/condition worsens, please seek follow up care either here or in the ER. Please remember, our [facility] providers are "right here with you" when you need [place].   Again, it was my pleasure to take care of you today. Thank you for choosing our clinic. I hope that you start to feel better quickly.   Quentin Mulling, MSN, APRN, FNP-C, CEN Advanced Practice Provider [facility]

## 2019-09-09 NOTE — ED Triage Notes (Signed)
Patient complains of cough that started on Thursday. States that his wife is concerned he may have Covid.

## 2019-09-10 LAB — SARS CORONAVIRUS 2 (TAT 6-24 HRS): SARS Coronavirus 2: NEGATIVE

## 2020-02-18 ENCOUNTER — Other Ambulatory Visit: Payer: Self-pay

## 2020-02-18 ENCOUNTER — Ambulatory Visit
Admission: EM | Admit: 2020-02-18 | Discharge: 2020-02-18 | Disposition: A | Discharge status: Home / Self Care | Payer: PRIVATE HEALTH INSURANCE | Attending: Family Medicine | Admitting: Family Medicine

## 2020-02-18 ENCOUNTER — Ambulatory Visit (INDEPENDENT_AMBULATORY_CARE_PROVIDER_SITE_OTHER): Payer: PRIVATE HEALTH INSURANCE

## 2020-02-18 ENCOUNTER — Telehealth: Payer: Self-pay | Admitting: Emergency Medicine

## 2020-02-18 DIAGNOSIS — S59912A Unspecified injury of left forearm, initial encounter: Secondary | ICD-10-CM

## 2020-02-18 DIAGNOSIS — S4992XA Unspecified injury of left shoulder and upper arm, initial encounter: Secondary | ICD-10-CM

## 2020-02-18 DIAGNOSIS — S52125A Nondisplaced fracture of head of left radius, initial encounter for closed fracture: Secondary | ICD-10-CM

## 2020-02-18 DIAGNOSIS — S59902A Unspecified injury of left elbow, initial encounter: Secondary | ICD-10-CM

## 2020-02-18 MED ORDER — MELOXICAM 15 MG PO TABS: 15.0000 mg | ORAL_TABLET | Freq: Every day | ORAL | 0 refills | Status: DC | PRN

## 2020-02-18 MED ORDER — HYDROCODONE-ACETAMINOPHEN 5-325 MG PO TABS: 1.0000 | ORAL_TABLET | Freq: Three times a day (TID) | ORAL | 0 refills | Status: DC | PRN

## 2020-02-18 NOTE — Discharge Instructions (Signed)
Rest, ice.  Medication as directed.  Wear sling.  Please call [facility] Orthopedics [phone]) OR [facility] [phone]) for an appt.

## 2020-02-18 NOTE — Telephone Encounter (Signed)
Patient called stating the Meloxicam is not helping with his pain.
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
## 2020-02-18 NOTE — Telephone Encounter (Signed)
Patient requesting something for pain.  Sending in Rx. [place] controlled substance database reviewed.  Everlene Other DO [facility]

## 2021-03-22 IMAGING — CR DG HUMERUS 2V *L*
2 series · 2 of 2 positions shown · non-contrast
Comparison: Concurrent forearm and elbow radiographs.

CLINICAL DATA: Injury

EXAM:
LEFT HUMERUS - 2+ VIEW

[humerus ap]
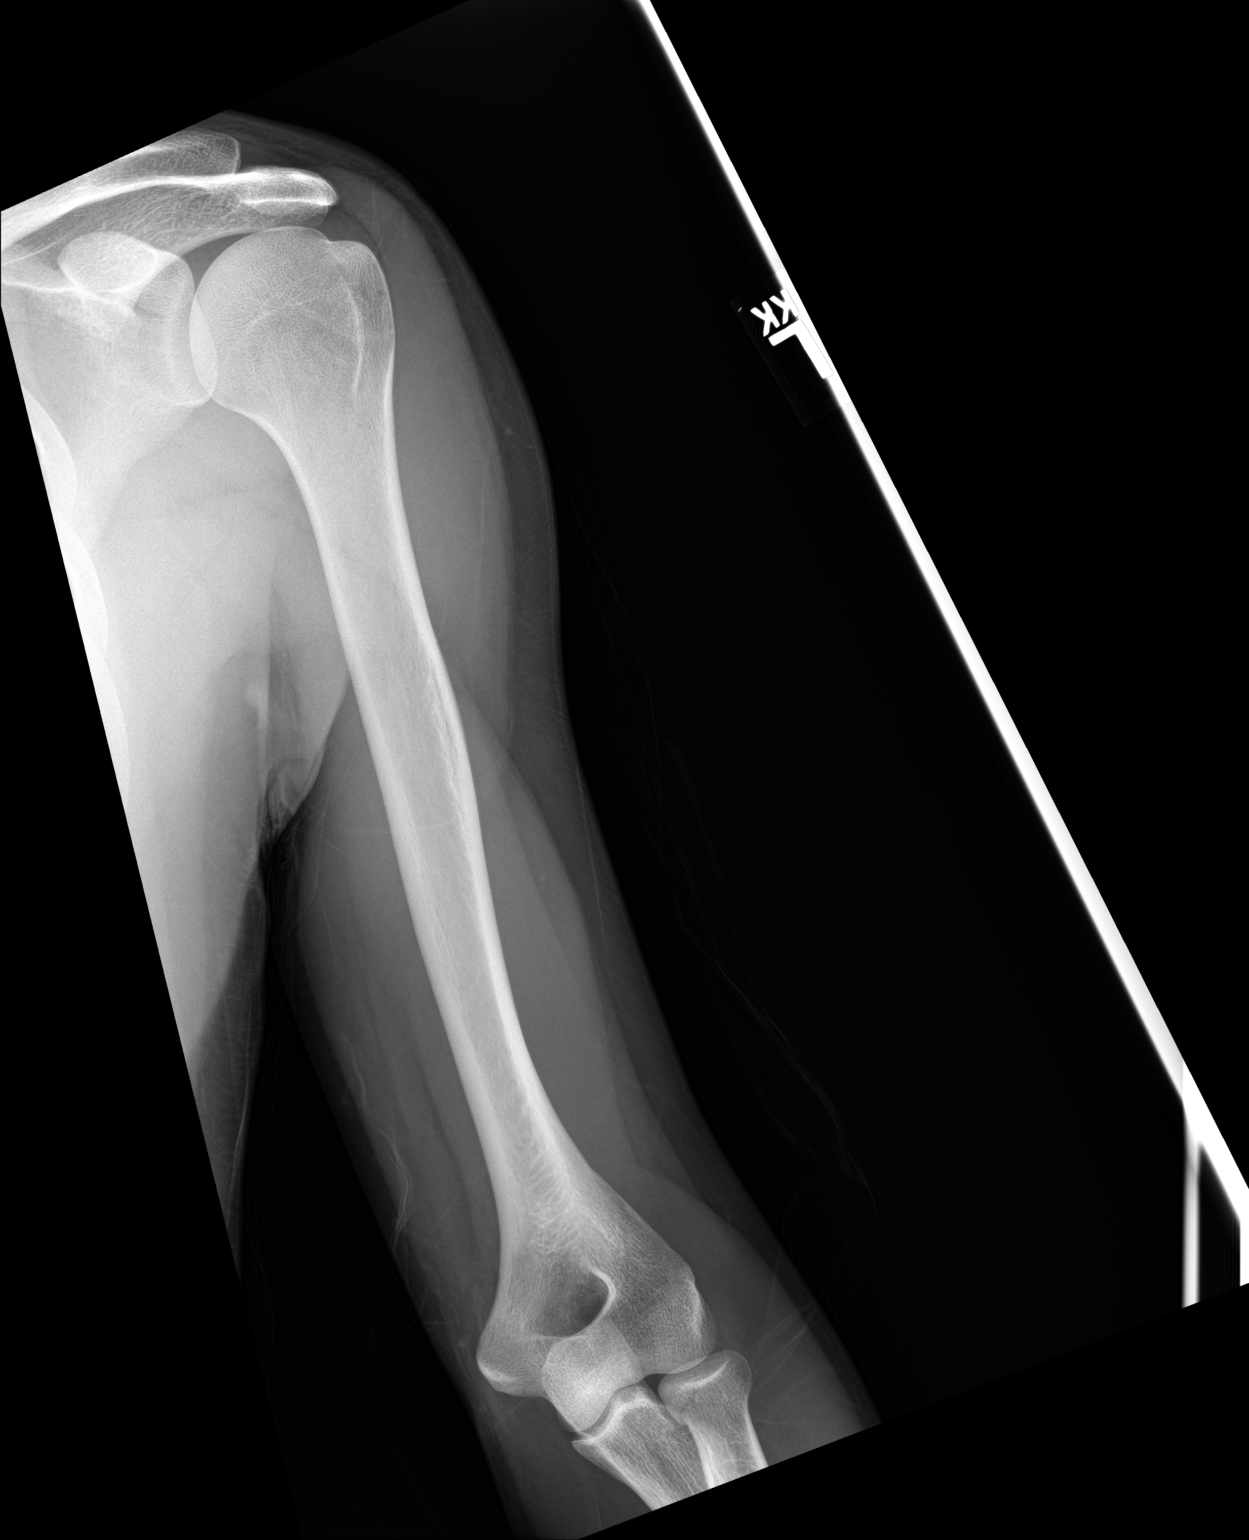

[humerus lat]
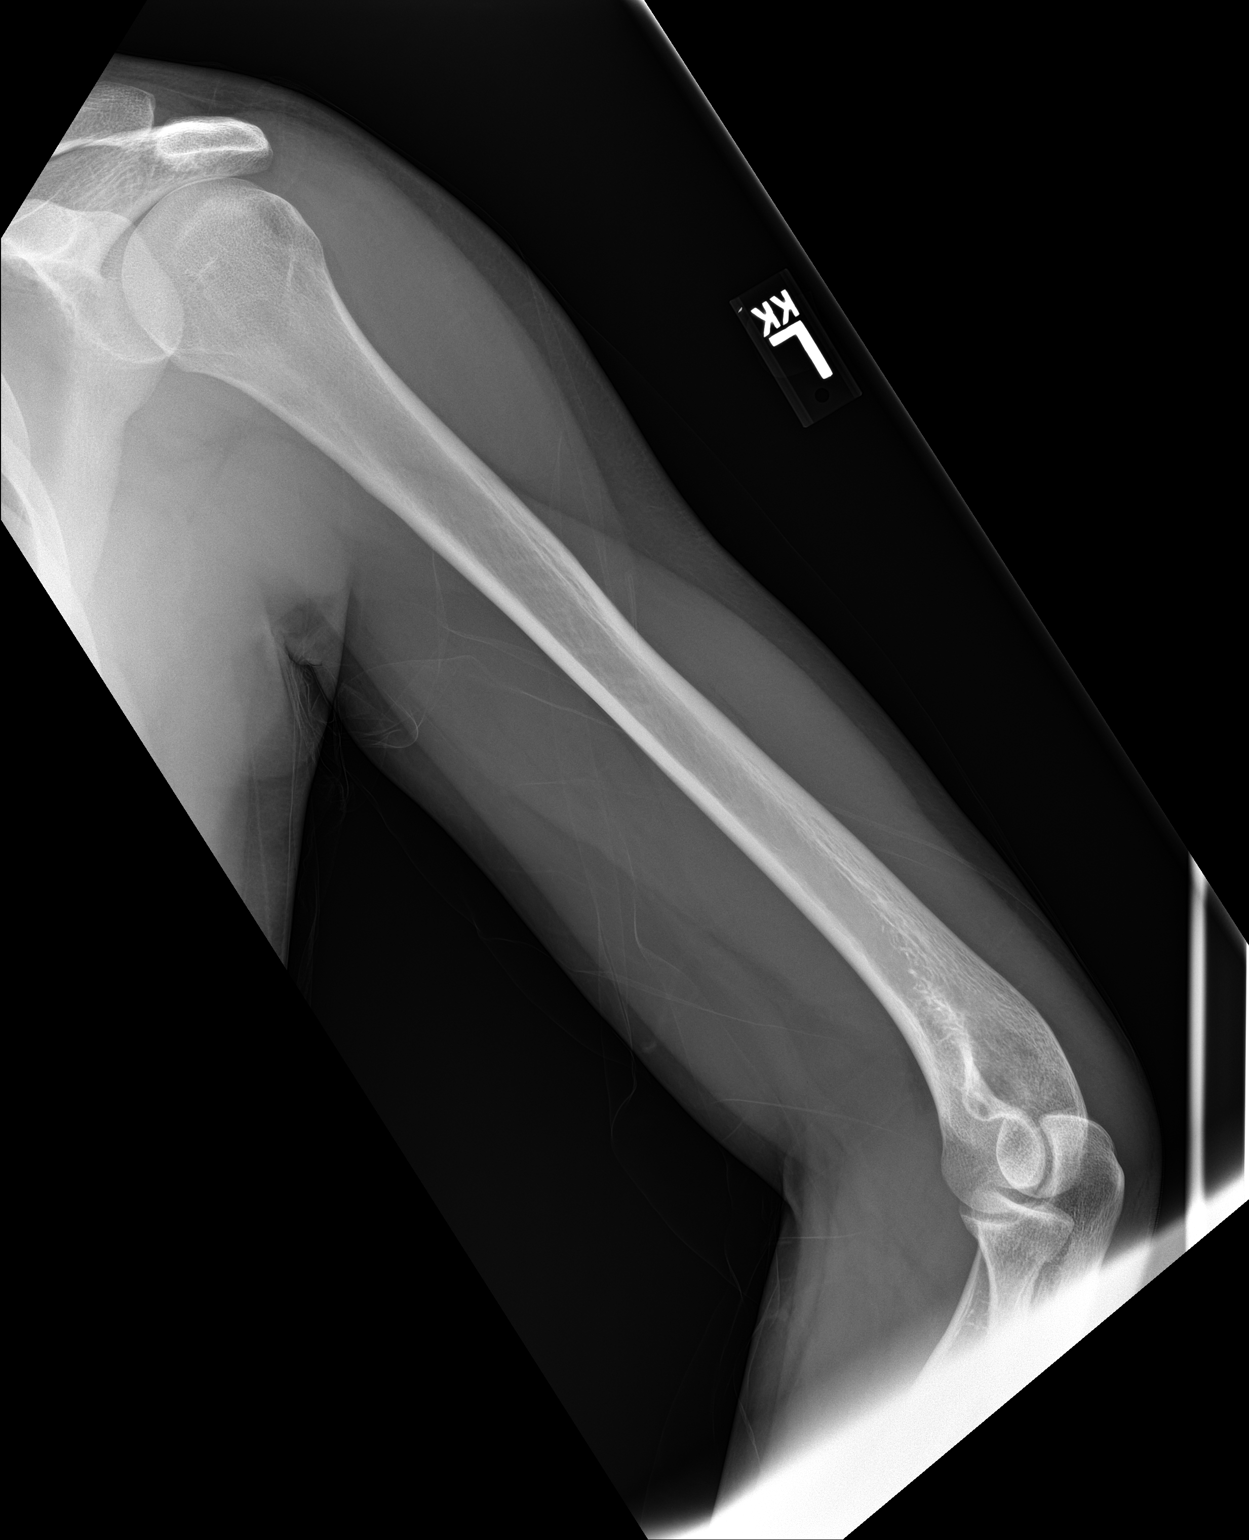

[2 of 2 positions shown; findings below may reference images not displayed]

FINDINGS: There is no evidence of fracture or other focal bone lesions. Soft
tissues are unremarkable.
IMPRESSION: No humeral fracture.

## 2022-03-03 ENCOUNTER — Encounter: Payer: Self-pay | Admitting: Urology

## 2022-03-03 ENCOUNTER — Ambulatory Visit (INDEPENDENT_AMBULATORY_CARE_PROVIDER_SITE_OTHER): Payer: 59 | Admitting: Urology

## 2022-03-03 VITALS — BP 128/76 | HR 64 | Ht 69.0 in | Wt 168.4 lb

## 2022-03-03 DIAGNOSIS — Z3009 Encounter for other general counseling and advice on contraception: Secondary | ICD-10-CM | POA: Diagnosis not present

## 2022-03-03 MED ORDER — DIAZEPAM 10 MG PO TABS: 10.0000 mg | ORAL_TABLET | Freq: Once | ORAL | 0 refills | Status: AC

## 2022-03-03 MED ORDER — DIAZEPAM 10 MG PO TABS: 10.0000 mg | ORAL_TABLET | Freq: Once | ORAL | 0 refills | Status: DC

## 2022-03-03 NOTE — Addendum Note (Signed)
Addended by: Hollice Espy on: 03/03/2022 03:58 PM   Modules accepted: Orders

## 2022-03-03 NOTE — Patient Instructions (Signed)

## 2022-03-03 NOTE — Progress Notes (Signed)
03/03/2022 3:30 PM   Derrick Porter March 12, 1991 [id number]  Referring provider: No referring provider defined for this encounter.  Chief Complaint  Patient presents with   VAS Consult    HPI: 31 y.o. year old male referred for further evaluation of possible vasectomy.  He denies a history of testicular trauma or pain.  No urinary issues.  No previous scrotal surgeries.  He has 2 biological children and his wife has medical reasons for not wanting to be pregnant again.  His  youngest 32 years old.     PMH: Past Medical History:  Diagnosis Date   Chronic hand pain    Wrist pain, chronic     Surgical History: No past surgical history on file.  Home Medications:  Allergies as of 03/03/2022   No Known Allergies      Medication List        Accurate as of March 03, 2022  3:30 PM. If you have any questions, ask your nurse or doctor.          STOP taking these medications    HYDROcodone-acetaminophen 5-325 MG tablet Commonly known as: NORCO/VICODIN Stopped by: Hollice Espy, MD   meloxicam 15 MG tablet Commonly known as: MOBIC Stopped by: Hollice Espy, MD        Allergies: No Known Allergies  Family History: Family History  Problem Relation Age of Onset   Healthy Mother    Hypertension Father     Social History:  reports that he has quit smoking. His smokeless tobacco use includes chew. He reports current alcohol use. He reports that he does not use drugs.   Physical Exam: BP 128/76   Pulse 64   Ht 5\' 9"  (1.753 m)   Wt 168 lb 6.4 oz (76.4 kg)   BMI 24.87 kg/m   Constitutional:  Alert and oriented, No acute distress. HEENT: [place] AT, moist mucus membranes.  Trachea midline, no masses. Cardiovascular: No clubbing, cyanosis, or edema. Respiratory: Normal respiratory effort, no increased work of breathing. GI: Abdomen is soft, nontender, nondistended, no abdominal masses GU: Normal phallus.  Bilateral descended testicles without masses.   Vasa easily palpable bilaterally. Skin: No rashes, bruises or suspicious lesions. Neurologic: Grossly intact, no focal deficits, moving all 4 extremities. Psychiatric: Normal mood and affect.   Assessment & Plan:    1. Vasectomy evaluation Today, we discussed what the vas deferens is, where it is located, and its function. We reviewed the procedure for vasectomy, it's risks, benefits, alternatives, and likelihood of achieving his goals. We discussed in detail the procedure, complications, and recovery as well as the need for clearance prior to unprotected intercourse. We discussed that vasectomy does not protect against sexually transmitted diseases. We discussed that this procedure does not result in immediate sterility and that they would need to use other forms of birth control until he has been cleared with negative postvasectomy semen analyses. I explained that the procedure is considered to be permanent and that attempts at reversal have varying degrees of success. These options include vasectomy reversal, sperm retrieval, and in vitro fertilization; these can be very expensive. We discussed the chance of postvasectomy pain syndrome which occurs in less than 5% of patients. I explained to the patient that there is no treatment to resolve this chronic pain, and that if it developed I would not be able to help resolve the issue, but that surgery is generally not needed for correction. I explained there have even been reports of systemic like  illness associated with this chronic pain, and that there was no good cure. I explained that vasectomy it is not a 100% reliable form of birth control, and the risk of pregnancy after vasectomy is approximately 1 in 2000 men who had a negative postvasectomy semen analysis or rare non-motile sperm. I explained that repeat vasectomy was necessary in less than 1% of vasectomy procedures when employing the type of technique that I use. I explained that he should refrain  from ejaculation for approximately one week following vasectomy. I explained that there are other options for birth control which are permanent and non-permanent; we discussed these. I explained the rates of surgical complications, such as symptomatic hematoma or infection, are low (1-2%) and vary with the surgeon's experience and criteria used to diagnose the complication.   The patient had the opportunity to ask questions to his stated satisfaction. He voiced understanding of the above factors and stated that he has read all the information provided to him and the packets and informed consent.  He is interested in receiving of Valium 10 mg prior to the procedure for the purpose of anxiolysis.  A prescription was given today.  He will have a driver on the day of the procedure.   Return for Schedule Vasectomy and FU appt.  Hollice Espy, MD  [facility] [address], [place], [place] [address] [phone]

## 2022-03-19 ENCOUNTER — Encounter: Payer: Self-pay | Admitting: Urology

## 2022-03-19 ENCOUNTER — Ambulatory Visit (INDEPENDENT_AMBULATORY_CARE_PROVIDER_SITE_OTHER): Payer: 59 | Admitting: Urology

## 2022-03-19 VITALS — BP 112/75 | HR 77 | Ht 69.0 in | Wt 164.0 lb

## 2022-03-19 DIAGNOSIS — Z302 Encounter for sterilization: Secondary | ICD-10-CM

## 2022-03-19 NOTE — Progress Notes (Signed)
03/19/22  CC:  Chief Complaint  Patient presents with   VAS    HPI: 31 year old male who presents today for vasectomy.  Blood pressure 112/75, pulse 77, height 5\' 9"  (1.753 m), weight 164 lb (74.4 kg). NED. A&Ox3.   No respiratory distress   Abd soft, NT, ND Normal external genitalia with patent urethral meatus  A timeout was performed.  Patient's identity and consent was confirmed.  All questions were answered.   Bilateral Vasectomy Procedure  Pre-Procedure: - Patient's scrotum was prepped and draped for vasectomy. - The vas was palpated through the scrotal skin on the left. - 1% Xylocaine was injected into the skin and surrounding tissue for placement  - In a similar manner, the vas on the right was identified, anesthetized, and stabilized.  Procedure: - A #11 blade was used to make a small stab incision in the skin overlying the vas - The left vas was isolated and brought up through the incision exposing that structure. - Bleeding points were cauterized as they occurred. - The vas was free from the surrounding structures and brought to the view. - A segment was positioned for placement with a hemostat. - A second hemostat was placed and a small segment between the two hemostats and was removed for inspection. - Each end of the transected vas lumen was fulgurated/ obliterated using needlepoint electrocautery -A fascial interposition was performed on testicular end of the vas using #3-0 chromic suture -The same procedure was performed on the right. - A single suture of #3-0 chromic catgut was used to close each lateral scrotal skin incision - A dressing was applied.  Post-Procedure: - Patient was instructed in care of the operative area - A specimen is to be delivered in 12 weeks   -Another form of contraception is to be used until post vasectomy semen analysis  , MD

## 2022-03-19 NOTE — Patient Instructions (Signed)
Vasectomy, Care After This sheet gives you information about how to care for yourself after your procedure. Your health care provider may also give you more specific instructions. If you have problems or questions, contact your health care provider. What can I expect after the procedure? After the procedure, it is common to have: Mild pain, swelling, or discomfort in your scrotum or redness on your scrotum. Some blood coming from your incisions or puncture sites for 1 or 2 days. Blood in your semen. Follow these instructions at home: Medicines Take over-the-counter and prescription medicines only as told by your health care provider. Avoid taking any medicines that contain aspirin or NSAIDs, such as ibuprofen. These medicines can make bleeding worse. Activity For the first 2 days after surgery, avoid physical activity and exercise that requires a lot of energy. Ask your health care provider what activities are safe for you. Do not take part in sports or perform heavy physical labor until your pain has improved, or until your health care provider says it is okay. You may have limits on the amount of weight you can lift as told by your health care provider. Do not ejaculate for at least 1 week after the procedure, or for as long as you are told. You may resume sexual activity 7-10 days after your procedure, or when your health care provider approves. Use a different method of birth control (contraception) until you have had test results that confirm that there is no sperm in your semen. Scrotal support Use scrotal support, such as a jockstrap or underwear with a supportive pouch, as needed for 1 week after your procedure. If you feel discomfort in your scrotum, you may remove the scrotal support to see if the discomfort is relieved. Sometimes scrotal support can press on the scrotum and cause or worsen discomfort. If your skin gets irritated, you may add some germ-free (sterile), fluffed bandages  or a clean washcloth to the scrotal support. Managing pain and swelling If directed, put ice on the affected area. To do this: Put ice in a plastic bag. Place a towel between your skin and the bag. Leave the ice on for 20 minutes, 2-3 times a day. Remove the ice if your skin turns bright red. This is very important. If you cannot feel pain, heat, or cold, you have a greater risk of damage to the area.  General instructions  Check your incisions or puncture sites every day for signs of infection. Check for: Redness, swelling, or more pain. Fluid or blood. Warmth. Pus or a bad smell. Leave stitches (sutures) in place. The sutures will dissolve on their own and do not need to be removed. Keep all follow-up visits. This is important because you will need a test to confirm that there is no sperm in your semen. Multiple ejaculations are needed to clear out sperm that were beyond the vasectomy site. You will need one test result showing that there is no sperm in your semen before you can resume unprotected sex. This may take 2-4 months after your procedure. If you were given a sedative during the procedure, it can affect you for several hours. Do not drive or operate machinery until your health care provider says that it is safe. Contact a health care provider if: You have redness, swelling, or more pain around an incision or puncture site, or in your scrotum area. You have bleeding from an incision or puncture site. You have pus or a bad smell coming from an incision   or puncture site. You have a fever. An incision or puncture site opens up. Get help right away if: You develop a rash. You have trouble breathing. Summary After your procedure, it is common to have mild pain, swelling, redness, or discomfort in your scrotum. For the first 2 days after surgery, avoid physical activity and exercise that requires a lot of energy. Put ice on the affected area. Leave the ice on for 20 minutes, 2-3  times a day. If you were given a sedative during the procedure, it can affect you for several hours. Do not drive or operate machinery until your health care provider says that it is safe. This information is not intended to replace advice given to you by your health care provider. Make sure you discuss any questions you have with your health care provider. Document Revised: 09/06/2019 Document Reviewed: 09/06/2019 Elsevier Patient Education  2023 [employer].

## 2022-06-18 ENCOUNTER — Other Ambulatory Visit: Payer: 59

## 2022-06-18 DIAGNOSIS — Z302 Encounter for sterilization: Secondary | ICD-10-CM

## 2022-06-19 LAB — POST-VAS SPERM EVALUATION,QUAL: Volume: 3 mL

## 2023-07-25 ENCOUNTER — Encounter: Payer: Self-pay | Admitting: Emergency Medicine

## 2023-07-25 ENCOUNTER — Other Ambulatory Visit: Payer: Self-pay

## 2023-07-25 ENCOUNTER — Ambulatory Visit
Admission: EM | Admit: 2023-07-25 | Discharge: 2023-07-25 | Disposition: A | Discharge status: Home / Self Care | Attending: Emergency Medicine | Admitting: Emergency Medicine

## 2023-07-25 DIAGNOSIS — J020 Streptococcal pharyngitis: Secondary | ICD-10-CM

## 2023-07-25 LAB — POCT RAPID STREP A (OFFICE): Rapid Strep A Screen: POSITIVE — AB

## 2023-07-25 MED ORDER — AMOXICILLIN 500 MG PO CAPS: 500.0000 mg | ORAL_CAPSULE | Freq: Two times a day (BID) | ORAL | 0 refills | Status: AC

## 2023-07-25 NOTE — ED Provider Notes (Signed)
 [facility]    CSN: [id number] Arrival date & time: 07/25/23  1005      History   Chief Complaint Chief Complaint  Patient presents with   Sore Throat    HPI Derrick Porter is a 33 y.o. male.   Patient presents for evaluation of a fever peaking at 101, sore throat and intermittent headaches present for 2 days.  Known sick contact in household who tested positive for strep this morning.  Poor intake.  Has not attempted treatment.  Past Medical History:  Diagnosis Date   Chronic hand pain    Wrist pain, chronic     There are no active problems to display for this patient.   Past Surgical History:  Procedure Laterality Date   VASECTOMY         Home Medications    Prior to Admission medications   Medication Sig Start Date End Date Taking? Authorizing Provider  amoxicillin (AMOXIL) 500 MG capsule Take 1 capsule (500 mg total) by mouth 2 (two) times daily for 10 days. 07/25/23 08/04/23 Yes Karma Hiney, Elita Boone, NP    Family History Family History  Problem Relation Age of Onset   Healthy Mother    Hypertension Father     Social History Social History   Tobacco Use   Smoking status: Former    Passive exposure: Past   Smokeless tobacco: Current    Types: Chew  Vaping Use   Vaping status: Never Used  Substance Use Topics   Alcohol use: Yes   Drug use: No     Allergies   Patient has no known allergies.   Review of Systems Review of Systems   Physical Exam Triage Vital Signs ED Triage Vitals  Encounter Vitals Group     BP 07/25/23 1021 123/83     Systolic BP Percentile --      Diastolic BP Percentile --      Pulse Rate 07/25/23 1021 68     Resp 07/25/23 1021 18     Temp 07/25/23 1021 98.2 F (36.8 C)     Temp Source 07/25/23 1021 Oral     SpO2 07/25/23 1021 96 %     Weight --      Height --      Head Circumference --      Peak Flow --      Pain Score 07/25/23 1029 3     Pain Loc --      Pain Education --      Exclude from  Growth Chart --    No data found.  Updated Vital Signs BP 123/83 (BP Location: Left Arm)   Pulse 68   Temp 98.2 F (36.8 C) (Oral)   Resp 18   SpO2 96%   Visual Acuity Right Eye Distance:   Left Eye Distance:   Bilateral Distance:    Right Eye Near:   Left Eye Near:    Bilateral Near:     Physical Exam Constitutional:      Appearance: He is well-developed.  HENT:     Right Ear: Tympanic membrane and ear canal normal.     Left Ear: Tympanic membrane and ear canal normal.     Nose: No congestion or rhinorrhea.     Mouth/Throat:     Pharynx: Posterior oropharyngeal erythema present. No oropharyngeal exudate.     Tonsils: No tonsillar exudate or tonsillar abscesses.  Pulmonary:     Effort: Pulmonary effort is normal.  Breath sounds: Normal breath sounds.  Musculoskeletal:     Cervical back: Normal range of motion.  Lymphadenopathy:     Cervical: Cervical adenopathy present.  Neurological:     General: No focal deficit present.     Mental Status: He is alert and oriented to person, place, and time.      UC Treatments / Results  Labs (all labs ordered are listed, but only abnormal results are displayed) Labs Reviewed  POCT RAPID STREP A (OFFICE) - Abnormal; Notable for the following components:      Result Value   Rapid Strep A Screen Positive (*)    All other components within normal limits    EKG   Radiology No results found.  Procedures Procedures (including critical care time)  Medications Ordered in UC Medications - No data to display  Initial Impression / Assessment and Plan / UC Course  I have reviewed the triage vital signs and the nursing notes.  Pertinent labs & imaging results that were available during my care of the patient were reviewed by me and considered in my medical decision making (see chart for details).  Strep pharyngitis  Rapid testing positive, discussed with patient, amoxicillin 10-day course prescribed, may attempt salt  water gargles, throat lozenges, warm to cool liquids per preference, over-the-counter Chloraseptic spray and over-the-counter analgesics for management of discomfort, may follow-up with urgent care as needed if symptoms persist or worsen, note given  Final Clinical Impressions(s) / UC Diagnoses   Final diagnoses:  Strep pharyngitis     Discharge Instructions      Your rapid strep test today was positive  Take amoxicillin twice a day for the next 10 days, daily will see improvement in about 48 hours and steady progression from there  may use of salt gargles throat lozenges, warm liquids, teaspoons of honey and over-the-counter clippers septic spray for comfort  May give Tylenol or Motrin every 6 hours as needed for additional comfort  You may follow-up at urgent care as needed      ED Prescriptions     Medication Sig Dispense Auth. Provider   amoxicillin (AMOXIL) 500 MG capsule Take 1 capsule (500 mg total) by mouth 2 (two) times daily for 10 days. 20 capsule Valinda Hoar, NP      PDMP not reviewed this encounter.   Valinda Hoar, NP 07/25/23 1048

## 2023-07-25 NOTE — Discharge Instructions (Addendum)
Your rapid strep test today was positive  Take amoxicillin twice a day for the next 10 days, daily will see improvement in about 48 hours and steady progression from there  may use of salt gargles throat lozenges, warm liquids, teaspoons of honey and over-the-counter clippers septic spray for comfort  May give Tylenol or Motrin every 6 hours as needed for additional comfort  You may follow-up at urgent care as needed

## 2023-07-25 NOTE — ED Triage Notes (Signed)
 Sore throat, fever, chills for 2 days.  Spouse tested positive for strep

## 2023-12-13 ENCOUNTER — Ambulatory Visit: Admitting: Family Medicine

## 2023-12-14 NOTE — Patient Instructions (Addendum)

## 2023-12-14 NOTE — Progress Notes (Signed)
 New Patient Visit  Subjective:     Patient ID: Derrick Porter, male    DOB: 06-20-1990, 33 y.o.   MRN: [id number]  Chief Complaint  Patient presents with   New Patient (Initial Visit)    Establish care. Pt stated he has been having some chest pain with the past 3 years with 4 major episode, last one being 8 months ago where he had a fall and was not aware how long her laid there. Has had minor chest pain in between    HPI  Discussed the use of AI scribe software for clinical note transcription with the patient, who gave verbal consent to proceed.  History of Present Illness Derrick Porter is a 33 year old male who presents with intermittent chest pain.  Intermittent chest pain - Intermittent stabbing chest pain radiating from the anterior to posterior chest - Pain episodes cause temporary immobility and once resulted in collapse without loss of consciousness (eight months ago) - Pain lasts a few minutes per episode - Described as tight, making him not want to breathe and causing him to curl up - No associated shortness of breath, diaphoresis, or other pain - Episodes are infrequent and without identifiable triggers - Previous EKG and 48-hour mobile cardiac monitor were normal  Musculoskeletal history - Games developer by occupation - Engages in downhill mountain biking - No chest injuries - Previous muscle-related injury from mountain biking  Anxiety symptoms - Experiences anxiety in crowded environments, described as anxiety attacks - Crowded places are overwhelming, especially due to hearing aid use - Anxiety attacks do not trigger chest pain  Medication use - No current use of prescription or over-the-counter medications, including pain relievers     ROS Per HPI  No outpatient encounter medications on file as of 12/16/2023.   No facility-administered encounter medications on file as of 12/16/2023.    Past Medical History:  Diagnosis Date   Chronic hand  pain    Wrist pain, chronic     Past Surgical History:  Procedure Laterality Date   VASECTOMY      Family History  Problem Relation Age of Onset   Healthy Mother    Hypertension Father    Healthy Daughter    Healthy Son    Healthy Maternal Grandmother    Healthy Paternal Grandmother     Social History   Socioeconomic History   Marital status: Single    Spouse name: Not on file   Number of children: Not on file   Years of education: Not on file   Highest education level: Not on file  Occupational History   Not on file  Tobacco Use   Smoking status: Former    Passive exposure: Past   Smokeless tobacco: Current    Types: Chew  Vaping Use   Vaping status: Never Used  Substance and Sexual Activity   Alcohol use: Yes    Alcohol/week: 3.0 standard drinks of alcohol    Types: 3 Cans of beer per week   Drug use: No   Sexual activity: Yes    Birth control/protection: Surgical    Comment: Vasectomy 03/19/2022  Other Topics Concern   Not on file  Social History Narrative   Not on file   Social Drivers of Health   Financial Resource Strain: Not on file  Food Insecurity: Not on file  Transportation Needs: Not on file  Physical Activity: Not on file  Stress: Not on file  Social Connections: Not on file  Intimate Partner Violence: Not on file       Objective:    BP 130/82   Pulse 63   Temp 98.3 F (36.8 C) (Temporal)   Ht 5' 9 (1.753 m)   Wt 166 lb 2 oz (75.4 kg)   SpO2 97%   BMI 24.53 kg/m    Physical Exam Vitals and nursing note reviewed.  Constitutional:      General: He is not in acute distress.    Appearance: Normal appearance. He is normal weight.  HENT:     Head: Normocephalic and atraumatic.     Comments: Bilateral hearing aids in place    Right Ear: External ear normal.     Left Ear: External ear normal.     Nose: Nose normal.     Mouth/Throat:     Mouth: Mucous membranes are moist.     Pharynx: Oropharynx is clear.  Eyes:      Extraocular Movements: Extraocular movements intact.  Cardiovascular:     Rate and Rhythm: Normal rate and regular rhythm.     Pulses: Normal pulses.     Heart sounds: Normal heart sounds.  Pulmonary:     Effort: Pulmonary effort is normal. No respiratory distress.     Breath sounds: Normal breath sounds. No wheezing, rhonchi or rales.  Musculoskeletal:        General: Normal range of motion.     Cervical back: Normal range of motion.     Right lower leg: No edema.     Left lower leg: No edema.  Lymphadenopathy:     Cervical: No cervical adenopathy.  Skin:    General: Skin is warm and dry.  Neurological:     General: No focal deficit present.     Mental Status: He is alert and oriented to person, place, and time.  Psychiatric:        Mood and Affect: Mood normal.        Behavior: Behavior normal.   EKG: Indication: chest pain Rate: 59 Interpretation: Sinus brady Changes from previous: N/A   No results found for any visits on 12/16/23.      Assessment & Plan:   Assessment and Plan Assessment & Plan Recurrent chest pain Intermittent stabbing chest pain radiating to the back with normal previous EKG. Occupational strain and anxiety considered. - Order EKG for baseline assessment. - Order baseline labs. - Consider cardiology referral for further evaluation, including possible echocardiogram.     Orders Placed This Encounter  Procedures   CBC with Differential/Platelet    Release to patient:   Immediate [1]   Comprehensive metabolic panel with GFR    Release to patient:   Immediate [1]   Lipid panel   EKG 12-Lead     No orders of the defined types were placed in this encounter.   Return if symptoms worsen or fail to improve.  Corean LITTIE Ku, FNP

## 2023-12-16 ENCOUNTER — Encounter: Payer: Self-pay | Admitting: Family Medicine

## 2023-12-16 ENCOUNTER — Ambulatory Visit: Payer: Self-pay | Admitting: Family Medicine

## 2023-12-16 ENCOUNTER — Ambulatory Visit: Admitting: Family Medicine

## 2023-12-16 VITALS — BP 130/82 | HR 63 | Temp 98.3°F | Ht 69.0 in | Wt 166.1 lb

## 2023-12-16 DIAGNOSIS — R079 Chest pain, unspecified: Secondary | ICD-10-CM | POA: Diagnosis not present

## 2023-12-16 DIAGNOSIS — Z6824 Body mass index (BMI) 24.0-24.9, adult: Secondary | ICD-10-CM | POA: Diagnosis not present

## 2023-12-16 DIAGNOSIS — Z136 Encounter for screening for cardiovascular disorders: Secondary | ICD-10-CM | POA: Diagnosis not present

## 2023-12-16 DIAGNOSIS — R001 Bradycardia, unspecified: Secondary | ICD-10-CM | POA: Insufficient documentation

## 2023-12-16 LAB — LIPID PANEL
Cholesterol: 162 mg/dL (ref 0–200)
HDL: 47.9 mg/dL (ref >39.00)
LDL Cholesterol: 49 mg/dL (ref 0–99)
NonHDL: 114.55
Total CHOL/HDL Ratio: 3
Triglycerides: 329 mg/dL — ABNORMAL HIGH (ref 0.0–149.0)
VLDL: 65.8 mg/dL — ABNORMAL HIGH (ref 0.0–40.0)

## 2023-12-16 LAB — COMPREHENSIVE METABOLIC PANEL WITH GFR
ALT: 28 U/L (ref 0–53)
AST: 21 U/L (ref 0–37)
Albumin: 4.7 g/dL (ref 3.5–5.2)
Alkaline Phosphatase: 51 U/L (ref 39–117)
BUN: 17 mg/dL (ref 6–23)
CO2: 28 meq/L (ref 19–32)
Calcium: 9.4 mg/dL (ref 8.4–10.5)
Chloride: 102 meq/L (ref 96–112)
Creatinine, Ser: 0.81 mg/dL (ref 0.40–1.50)
GFR: 115.73 mL/min (ref >60.00)
Glucose, Bld: 90 mg/dL (ref 70–99)
Potassium: 3.9 meq/L (ref 3.5–5.1)
Sodium: 138 meq/L (ref 135–145)
Total Bilirubin: 0.4 mg/dL (ref 0.2–1.2)
Total Protein: 7 g/dL (ref 6.0–8.3)

## 2023-12-16 LAB — CBC WITH DIFFERENTIAL/PLATELET
Basophils Absolute: 0 K/uL (ref 0.0–0.1)
Basophils Relative: 0.6 % (ref 0.0–3.0)
Eosinophils Absolute: 0.1 K/uL (ref 0.0–0.7)
Eosinophils Relative: 1 % (ref 0.0–5.0)
HCT: 44.5 % (ref 39.0–52.0)
Hemoglobin: 14.9 g/dL (ref 13.0–17.0)
Lymphocytes Relative: 23.1 % (ref 12.0–46.0)
Lymphs Abs: 1.6 K/uL (ref 0.7–4.0)
MCHC: 33.6 g/dL (ref 30.0–36.0)
MCV: 86.3 fl (ref 78.0–100.0)
Monocytes Absolute: 0.7 K/uL (ref 0.1–1.0)
Monocytes Relative: 10.4 % (ref 3.0–12.0)
Neutro Abs: 4.6 K/uL (ref 1.4–7.7)
Neutrophils Relative %: 64.9 % (ref 43.0–77.0)
Platelets: 334 K/uL (ref 150.0–400.0)
RBC: 5.15 Mil/uL (ref 4.22–5.81)
RDW: 12.3 % (ref 11.5–15.5)
WBC: 7 K/uL (ref 4.0–10.5)

## 2024-03-22 ENCOUNTER — Ambulatory Visit
Admission: EM | Admit: 2024-03-22 | Discharge: 2024-03-22 | Disposition: A | Discharge status: Home / Self Care | Attending: Emergency Medicine | Admitting: Emergency Medicine

## 2024-03-22 DIAGNOSIS — J069 Acute upper respiratory infection, unspecified: Secondary | ICD-10-CM | POA: Diagnosis not present

## 2024-03-22 MED ORDER — BENZONATATE 100 MG PO CAPS: 100.0000 mg | ORAL_CAPSULE | Freq: Three times a day (TID) | ORAL | 0 refills | Status: AC | PRN

## 2024-03-22 MED ORDER — AZITHROMYCIN 250 MG PO TABS: 250.0000 mg | ORAL_TABLET | Freq: Every day | ORAL | 0 refills | Status: AC

## 2024-03-22 NOTE — Discharge Instructions (Signed)
 Take the Zithromax and Tessalon Perles as directed.    Follow up with your primary care provider if your symptoms are not improving.

## 2024-03-22 NOTE — ED Provider Notes (Signed)
 Derrick Porter    CSN: [id number] Arrival date & time: 03/22/24  9162      History   Chief Complaint Chief Complaint  Patient presents with   Cough    HPI Derrick Porter is a 33 y.o. male.  Patient presents with 1 week history of nonproductive cough, postnasal drainage, and ear pain.  No fever or shortness of breath.  He reports coughing episodes where he coughed so much that he gasps for air or throws up.  Treatment attempted with OTC cough medication and cough drops without relief.  No history of lung disease.    The history is provided by the patient and medical records.    Past Medical History:  Diagnosis Date   Chronic hand pain    Wrist pain, chronic     Patient Active Problem List   Diagnosis Date Noted   Chest pain 12/16/2023   BMI 24.0-24.9, adult 12/16/2023   Sinus bradycardia 12/16/2023   Encounter for screening for cardiovascular disorders 12/16/2023    Past Surgical History:  Procedure Laterality Date   VASECTOMY         Home Medications    Prior to Admission medications   Medication Sig Start Date End Date Taking? Authorizing Provider  azithromycin (ZITHROMAX) 250 MG tablet Take 1 tablet (250 mg total) by mouth daily. Take first 2 tablets together, then 1 every day until finished. 03/22/24  Yes Corlis Burnard DEL, NP  benzonatate (TESSALON) 100 MG capsule Take 1 capsule (100 mg total) by mouth 3 (three) times daily as needed for cough. 03/22/24  Yes Corlis Burnard DEL, NP    Family History Family History  Problem Relation Age of Onset   Healthy Mother    Hypertension Father    Healthy Daughter    Healthy Son    Healthy Maternal Grandmother    Healthy Paternal Grandmother     Social History Social History   Tobacco Use   Smoking status: Former    Passive exposure: Past   Smokeless tobacco: Current    Types: Chew  Vaping Use   Vaping status: Never Used  Substance Use Topics   Alcohol use: Yes    Alcohol/week: 3.0 standard drinks  of alcohol    Types: 3 Cans of beer per week   Drug use: No     Allergies   Patient has no known allergies.   Review of Systems Review of Systems  Constitutional:  Negative for chills and fever.  HENT:  Positive for ear pain and postnasal drip. Negative for sore throat.   Respiratory:  Positive for cough. Negative for shortness of breath.      Physical Exam Triage Vital Signs ED Triage Vitals  Encounter Vitals Group     BP 03/22/24 0904 139/89     Girls Systolic BP Percentile --      Girls Diastolic BP Percentile --      Boys Systolic BP Percentile --      Boys Diastolic BP Percentile --      Pulse Rate 03/22/24 0904 76     Resp 03/22/24 0904 18     Temp 03/22/24 0904 98 F (36.7 C)     Temp src --      SpO2 03/22/24 0904 97 %     Weight --      Height --      Head Circumference --      Peak Flow --      Pain Score 03/22/24 0916  7     Pain Loc --      Pain Education --      Exclude from Growth Chart --    No data found.  Updated Vital Signs BP 139/89   Pulse 76   Temp 98 F (36.7 C)   Resp 18   SpO2 97%   Visual Acuity Right Eye Distance:   Left Eye Distance:   Bilateral Distance:    Right Eye Near:   Left Eye Near:    Bilateral Near:     Physical Exam Constitutional:      General: He is not in acute distress. HENT:     Right Ear: Tympanic membrane normal.     Left Ear: Tympanic membrane normal.     Nose: Nose normal.     Mouth/Throat:     Mouth: Mucous membranes are moist.     Pharynx: Oropharynx is clear.     Comments: PND Cardiovascular:     Rate and Rhythm: Normal rate and regular rhythm.     Heart sounds: Normal heart sounds.  Pulmonary:     Effort: Pulmonary effort is normal. No respiratory distress.     Breath sounds: Normal breath sounds.  Neurological:     Mental Status: He is alert.      UC Treatments / Results  Labs (all labs ordered are listed, but only abnormal results are displayed) Labs Reviewed - No data to  display  EKG   Radiology No results found.  Procedures Procedures (including critical care time)  Medications Ordered in UC Medications - No data to display  Initial Impression / Assessment and Plan / UC Course  I have reviewed the triage vital signs and the nursing notes.  Pertinent labs & imaging results that were available during my care of the patient were reviewed by me and considered in my medical decision making (see chart for details).    Acute upper respiratory infection.  Afebrile and vital signs are stable.  O2 sat 97% on room air.  Patient has been symptomatic for 7 days and is not improving with OTC treatment.  Treating today with Zithromax and Tessalon Perles.  Education provided on upper respiratory infection.  Instructed patient to follow-up with his PCP if he is not improving.  He agrees to plan of care.  Final Clinical Impressions(s) / UC Diagnoses   Final diagnoses:  Acute upper respiratory infection     Discharge Instructions      Take the Zithromax and Tessalon Perles as directed.  Follow-up with your primary care provider if your symptoms are not improving.      ED Prescriptions     Medication Sig Dispense Auth. Provider   azithromycin (ZITHROMAX) 250 MG tablet Take 1 tablet (250 mg total) by mouth daily. Take first 2 tablets together, then 1 every day until finished. 6 tablet Corlis Burnard DEL, NP   benzonatate (TESSALON) 100 MG capsule Take 1 capsule (100 mg total) by mouth 3 (three) times daily as needed for cough. 21 capsule Corlis Burnard DEL, NP      PDMP not reviewed this encounter.   Corlis Burnard DEL, NP 03/22/24 [phone]

## 2024-03-22 NOTE — ED Triage Notes (Signed)
 Patient to Urgent Care with complaints of cough. Worse with exertion. Reports at times finds himself gasping for air or throwing up.  Symptoms x7 days.   Using cough drops/ otc cold and flu.
